# Patient Record
Sex: Female | Born: 1955 | Race: White | Hispanic: No | Marital: Married | Smoking: Current every day smoker
Health system: Southern US, Community
[De-identification: ages and names within clinical notes are randomized; demographics above are authoritative.]

## PROBLEM LIST (undated history)

## (undated) DIAGNOSIS — G43909 Migraine, unspecified, not intractable, without status migrainosus: Secondary | ICD-10-CM

## (undated) DIAGNOSIS — T7840XA Allergy, unspecified, initial encounter: Secondary | ICD-10-CM

## (undated) DIAGNOSIS — N390 Urinary tract infection, site not specified: Secondary | ICD-10-CM

## (undated) DIAGNOSIS — B019 Varicella without complication: Secondary | ICD-10-CM

## (undated) HISTORY — DX: Migraine, unspecified, not intractable, without status migrainosus: G43.909

## (undated) HISTORY — PX: BACK SURGERY: SHX140

## (undated) HISTORY — DX: Allergy, unspecified, initial encounter: T78.40XA

## (undated) HISTORY — PX: TONSILLECTOMY: SUR1361

## (undated) HISTORY — DX: Varicella without complication: B01.9

## (undated) HISTORY — PX: TUBAL LIGATION: SHX77

## (undated) HISTORY — DX: Urinary tract infection, site not specified: N39.0

---

## 2000-03-13 ENCOUNTER — Other Ambulatory Visit: Admission: RE | Admit: 2000-03-13 | Discharge: 2000-03-13 | Payer: Self-pay | Admitting: *Deleted

## 2000-03-13 ENCOUNTER — Encounter (INDEPENDENT_AMBULATORY_CARE_PROVIDER_SITE_OTHER): Payer: Self-pay

## 2000-04-24 ENCOUNTER — Other Ambulatory Visit: Admission: RE | Admit: 2000-04-24 | Discharge: 2000-04-24 | Payer: Self-pay | Admitting: *Deleted

## 2001-08-10 ENCOUNTER — Other Ambulatory Visit: Admission: RE | Admit: 2001-08-10 | Discharge: 2001-08-10 | Payer: Self-pay | Admitting: *Deleted

## 2008-02-22 ENCOUNTER — Ambulatory Visit (HOSPITAL_COMMUNITY): Admission: RE | Admit: 2008-02-22 | Discharge: 2008-02-23 | Payer: Self-pay | Admitting: Orthopaedic Surgery

## 2008-07-17 ENCOUNTER — Encounter: Admission: RE | Admit: 2008-07-17 | Discharge: 2008-07-17 | Payer: Self-pay | Admitting: Family Medicine

## 2009-07-20 ENCOUNTER — Encounter: Admission: RE | Admit: 2009-07-20 | Discharge: 2009-07-20 | Payer: Self-pay | Admitting: Family Medicine

## 2009-11-28 IMAGING — CR DG LUMBAR SPINE 2-3V
3 series · 3 of 3 positions shown · non-contrast
Comparison: MRI lumbar spine performed each hospital on 01/14/2008.

CLINICAL DATA: 51-year-old female with recurrent to right herniated
nucleus the pulses for microdiskectomy.

LUMBAR SPINE - 2-3 VIEW

[view not recorded (1 of 3)]
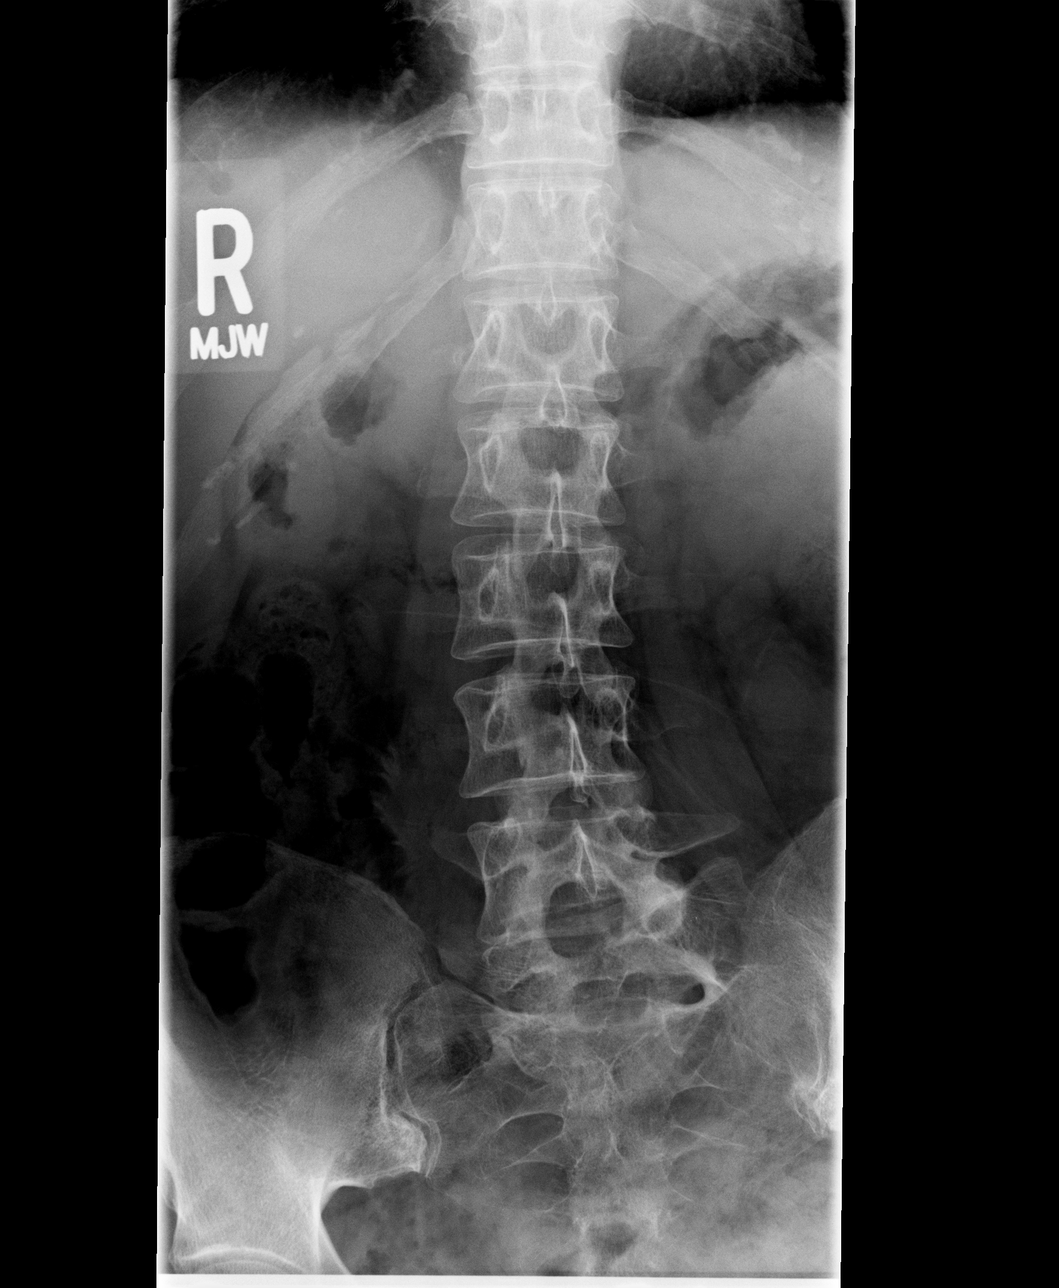

[view not recorded (2 of 3)]
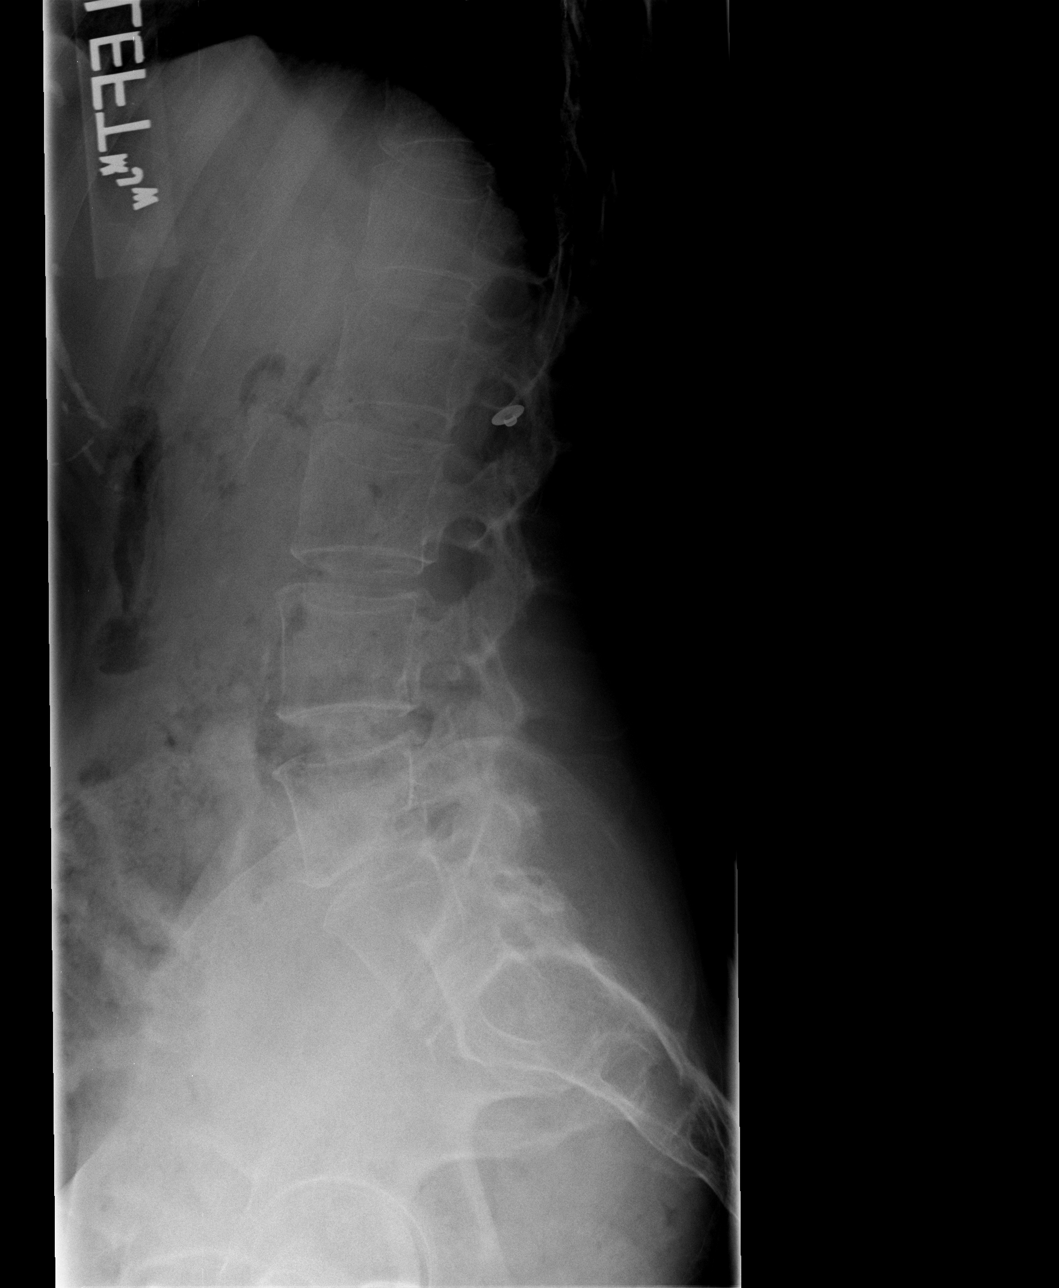

[view not recorded (3 of 3)]
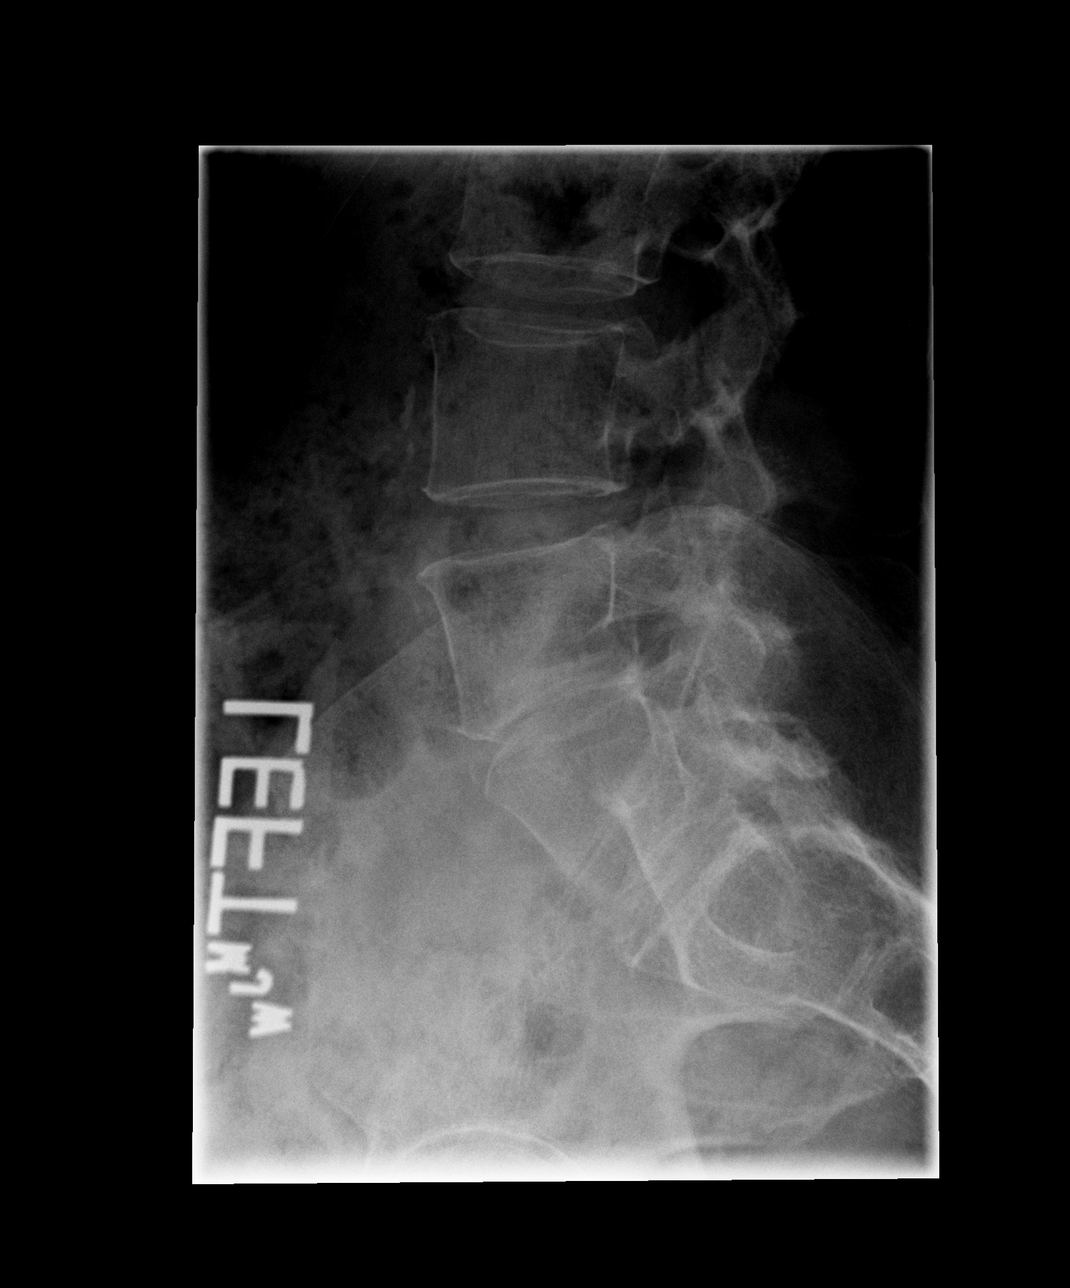

[3 of 3 positions shown; findings below may reference images not displayed]

FINDINGS: Transitional lumbosacral anatomy is identified with
partial lumbarization of S1.  The numbering scheme on this exam is
equivalent to that reported on the comparison MRI.

Rudimentary disc space at S1-S2 is noted.  Vertebral body height
and alignment appears stable without spondylolisthesis.  Visualized
sacrum and pelvic osseous structures appear intact.  Retained stool
the colon, nonobstructed bowel gas pattern.
IMPRESSION: 1.  Transitional anatomy with partial lumbarization of S1 as
reported on the comparison lumbar MRI.  The numbering scheme on
this exam is equivalent to that on the comparison.
2.  No acute osseous abnormality.

## 2010-07-21 ENCOUNTER — Encounter: Admission: RE | Admit: 2010-07-21 | Discharge: 2010-07-21 | Payer: Self-pay | Admitting: Family Medicine

## 2011-03-08 NOTE — Op Note (Signed)
[name], Theresa Collier NO.:  [id number]   MEDICAL RECORD NO.:  [id number]          PATIENT TYPE:   LOCATION:                                 FACILITY:   PHYSICIAN:  Mark C. Ophelia Charter, M.D.    DATE OF BIRTH:  08/06/1968   DATE OF PROCEDURE:  02/22/2008  DATE OF DISCHARGE:                               OPERATIVE REPORT   PREOPERATIVE DIAGNOSIS:  Recurrent L5-S1 herniated nucleus pulposus,  right, with radiculopathy.   POSTOPERATIVE DIAGNOSIS:  Recurrent L5-S1 herniated nucleus pulposus,  right, with radiculopathy.   PROCEDURES:  1. Right L5-S1 microdiskectomy.  2. Removal of free fragment for recurrent herniated nucleus pulposus.   SURGEON:  Mark C. Ophelia Charter, M.D.   ASSISTANT:  Wende Neighbors, P.A.   ANESTHESIA:  GET plus 10 mL Marcaine local.   ESTIMATED BLOOD LOSS:  Minimal.   DRAINS:  None.   DESCRIPTION OF PROCEDURE:  After induction of general anesthesia  orotracheal intubation, the patient was placed in [place] frame with  careful padding and positioning.  Time-out check list was performed.  Preoperative antibiotics were given.  The area was prepped with DuraPrep  and scrubbed with towels.  Sterile skin marker was used on the old  incision and Biodrape was applied.  Needle localization with the needle  showed that the needle was at L4-L5, one level above.  Incision started  just below this exposing L5-S1 space and the previous laminotomy  corresponding with the MRI scan was noted.  There was blood noted on the  dorsum at the mid inferior aspect of laminectomy which was in line with  the top portion of the disk.  Starting proximally, the dura was exposed  proximally.  Gutter was followed laterally along the right side removing  chunks of ligament and some overlying spurs.  Disk space was identified,  and immediately fragment was noted.  It was teased with ball-tipped  nerve hook, grasped, and removed in pieces.  It was adherent to the  anterior aspect  of the nerve.  Nerve root was carefully freed up.  A  foraminotomy was performed,  Nerve root was free on all sides.  Passes  were made to the disk space with straight and up micropituitary, down  micropituitary; 180-degree sweep anterior to the dura was made with the  dural separator with no areas of compression, and nerve root was  completely decompressed.  After irrigation with saline solution, one  final look using microscope again showed no remaining fragments.  Wound  was irrigated and closed in layers, 0 Vicryl in deep fascia, 2-0 in  subcutaneous tissue, subcuticular 4-0 Vicryl closure.  Benzoin, Steri-  Strips,  Marcaine infiltration, postoperative dressing, and transferred to  recovery room.  The patient had normal peroneal and gastroc-soleus  strength with improvement of her preoperative weakness and relief of  preoperative leg pain on exam in PACU.      Mark C. Ophelia Charter, M.D.  Electronically Signed     MCY/MEDQ  D:  02/22/2008  T:  02/23/2008  Job:  [id number]

## 2011-05-09 ENCOUNTER — Other Ambulatory Visit: Payer: Self-pay | Admitting: Legal Medicine

## 2011-05-09 ENCOUNTER — Other Ambulatory Visit: Payer: Self-pay | Admitting: *Deleted

## 2011-05-09 DIAGNOSIS — Z780 Asymptomatic menopausal state: Secondary | ICD-10-CM

## 2011-05-13 ENCOUNTER — Other Ambulatory Visit: Payer: Self-pay

## 2011-07-19 LAB — URINALYSIS, ROUTINE W REFLEX MICROSCOPIC
Bilirubin Urine: NEGATIVE
Hgb urine dipstick: NEGATIVE
Ketones, ur: NEGATIVE
Nitrite: NEGATIVE
Protein, ur: NEGATIVE
Urobilinogen, UA: 0.2

## 2011-07-19 LAB — CBC
HCT: 43.7
Hemoglobin: 14.8
MCHC: 33.8
RBC: 4.84
RDW: 14.2

## 2011-07-19 LAB — COMPREHENSIVE METABOLIC PANEL
Alkaline Phosphatase: 84
BUN: 3 — ABNORMAL LOW
CO2: 30
GFR calc non Af Amer: >60
Glucose, Bld: 93
Potassium: 4.2
Total Bilirubin: 0.7
Total Protein: 6.2

## 2011-07-19 LAB — PROTIME-INR
INR: 0.9
Prothrombin Time: 11.8

## 2011-07-19 LAB — APTT: aPTT: 27

## 2011-07-19 LAB — DIFFERENTIAL
Basophils Absolute: 0.1
Basophils Relative: 1
Eosinophils Absolute: 0.1
Monocytes Relative: 4
Neutro Abs: 6.5
Neutrophils Relative %: 68

## 2016-06-30 DIAGNOSIS — Z0000 Encounter for general adult medical examination without abnormal findings: Secondary | ICD-10-CM | POA: Diagnosis not present

## 2016-06-30 DIAGNOSIS — F17200 Nicotine dependence, unspecified, uncomplicated: Secondary | ICD-10-CM | POA: Diagnosis not present

## 2016-07-19 DIAGNOSIS — Z23 Encounter for immunization: Secondary | ICD-10-CM | POA: Diagnosis not present

## 2016-09-28 DIAGNOSIS — Z1231 Encounter for screening mammogram for malignant neoplasm of breast: Secondary | ICD-10-CM | POA: Diagnosis not present

## 2017-07-26 DIAGNOSIS — E7800 Pure hypercholesterolemia, unspecified: Secondary | ICD-10-CM | POA: Diagnosis not present

## 2017-07-26 DIAGNOSIS — E559 Vitamin D deficiency, unspecified: Secondary | ICD-10-CM | POA: Diagnosis not present

## 2017-07-26 DIAGNOSIS — Z1151 Encounter for screening for human papillomavirus (HPV): Secondary | ICD-10-CM | POA: Diagnosis not present

## 2017-07-26 DIAGNOSIS — Z01419 Encounter for gynecological examination (general) (routine) without abnormal findings: Secondary | ICD-10-CM | POA: Diagnosis not present

## 2017-07-26 DIAGNOSIS — Z1389 Encounter for screening for other disorder: Secondary | ICD-10-CM | POA: Diagnosis not present

## 2017-07-26 DIAGNOSIS — Z23 Encounter for immunization: Secondary | ICD-10-CM | POA: Diagnosis not present

## 2017-08-01 DIAGNOSIS — Z23 Encounter for immunization: Secondary | ICD-10-CM | POA: Diagnosis not present

## 2017-09-28 DIAGNOSIS — Z1231 Encounter for screening mammogram for malignant neoplasm of breast: Secondary | ICD-10-CM | POA: Diagnosis not present

## 2017-09-29 DIAGNOSIS — F411 Generalized anxiety disorder: Secondary | ICD-10-CM | POA: Diagnosis not present

## 2017-10-13 DIAGNOSIS — F411 Generalized anxiety disorder: Secondary | ICD-10-CM | POA: Diagnosis not present

## 2017-10-24 DIAGNOSIS — R55 Syncope and collapse: Secondary | ICD-10-CM | POA: Diagnosis not present

## 2017-10-24 DIAGNOSIS — I10 Essential (primary) hypertension: Secondary | ICD-10-CM | POA: Diagnosis not present

## 2017-10-24 DIAGNOSIS — D869 Sarcoidosis, unspecified: Secondary | ICD-10-CM | POA: Diagnosis not present

## 2017-10-24 DIAGNOSIS — R079 Chest pain, unspecified: Secondary | ICD-10-CM | POA: Diagnosis not present

## 2017-11-13 DIAGNOSIS — F411 Generalized anxiety disorder: Secondary | ICD-10-CM | POA: Diagnosis not present

## 2017-12-14 DIAGNOSIS — F411 Generalized anxiety disorder: Secondary | ICD-10-CM | POA: Diagnosis not present

## 2018-07-06 DIAGNOSIS — F411 Generalized anxiety disorder: Secondary | ICD-10-CM | POA: Diagnosis not present

## 2018-07-06 DIAGNOSIS — Z0000 Encounter for general adult medical examination without abnormal findings: Secondary | ICD-10-CM | POA: Diagnosis not present

## 2018-07-06 DIAGNOSIS — F17200 Nicotine dependence, unspecified, uncomplicated: Secondary | ICD-10-CM | POA: Diagnosis not present

## 2018-07-06 DIAGNOSIS — Z1159 Encounter for screening for other viral diseases: Secondary | ICD-10-CM | POA: Diagnosis not present

## 2018-08-08 DIAGNOSIS — Z23 Encounter for immunization: Secondary | ICD-10-CM | POA: Diagnosis not present

## 2018-10-03 DIAGNOSIS — Z1231 Encounter for screening mammogram for malignant neoplasm of breast: Secondary | ICD-10-CM | POA: Diagnosis not present

## 2018-12-06 DIAGNOSIS — J019 Acute sinusitis, unspecified: Secondary | ICD-10-CM | POA: Diagnosis not present

## 2018-12-06 DIAGNOSIS — Z6821 Body mass index (BMI) 21.0-21.9, adult: Secondary | ICD-10-CM | POA: Diagnosis not present

## 2019-11-21 ENCOUNTER — Ambulatory Visit: Payer: Self-pay | Attending: Internal Medicine

## 2019-11-21 DIAGNOSIS — Z20822 Contact with and (suspected) exposure to covid-19: Secondary | ICD-10-CM

## 2019-11-21 DIAGNOSIS — U071 COVID-19: Secondary | ICD-10-CM | POA: Insufficient documentation

## 2019-11-22 ENCOUNTER — Ambulatory Visit: Payer: Self-pay

## 2019-11-22 LAB — NOVEL CORONAVIRUS, NAA: SARS-CoV-2, NAA: DETECTED — AB

## 2019-11-22 NOTE — Telephone Encounter (Addendum)
Provided covid test results to Patient.  Reviewed isolation stopping protocol.  Voiced understanding.  Patient has no Sx.  Provided care Advice

## 2020-10-02 ENCOUNTER — Other Ambulatory Visit: Payer: Self-pay

## 2020-10-02 ENCOUNTER — Ambulatory Visit
Admission: EM | Admit: 2020-10-02 | Discharge: 2020-10-02 | Disposition: A | Discharge status: Home / Self Care | Payer: Self-pay | Attending: Family Medicine | Admitting: Family Medicine

## 2020-10-02 DIAGNOSIS — R300 Dysuria: Secondary | ICD-10-CM | POA: Insufficient documentation

## 2020-10-02 LAB — POCT URINALYSIS DIP (MANUAL ENTRY)
Bilirubin, UA: NEGATIVE
Glucose, UA: NEGATIVE mg/dL
Ketones, POC UA: NEGATIVE mg/dL
Nitrite, UA: NEGATIVE
Protein Ur, POC: NEGATIVE mg/dL
Spec Grav, UA: 1.01 (ref 1.010–1.025)
Urobilinogen, UA: 0.2 E.U./dL
pH, UA: 6.5 (ref 5.0–8.0)

## 2020-10-02 MED ORDER — CEPHALEXIN 500 MG PO CAPS: 500.0000 mg | ORAL_CAPSULE | Freq: Two times a day (BID) | ORAL | 0 refills | Status: AC

## 2020-10-02 NOTE — ED Triage Notes (Signed)
Pt reports having urinary frequency, burning with urination x3 days. sts she have been taking AZO since Wednesday without relief.

## 2020-10-02 NOTE — Discharge Instructions (Addendum)
Treating you for a urinary tract infection Take the antibiotic as prescribed.  Drink plenty of water to stay hydrated. AZO as needed

## 2020-10-02 NOTE — ED Provider Notes (Signed)
Theresa Collier    CSN: [id number] Arrival date & time: 10/02/20  1013      History   Chief Complaint Chief Complaint  Patient presents with  . Dysuria    HPI Theresa Collier is a 64 y.o. female.   Patient is a 64 year old female presents today with urinary frequency, dysuria for 3 days.  Has been taking AZO with some relief.  No fevers, chills, nausea, flank pain.     History reviewed. No pertinent past medical history.  There are no problems to display for this patient.   Past Surgical History:  Procedure Laterality Date  . BACK SURGERY    . TONSILLECTOMY    . TUBAL LIGATION      OB History   No obstetric history on file.      Home Medications    Prior to Admission medications   Medication Sig Start Date End Date Taking? Authorizing Provider  cephALEXin (KEFLEX) 500 MG capsule Take 1 capsule (500 mg total) by mouth 2 (two) times daily for 5 days. 10/02/20 10/07/20  Janace Aris, NP    Family History History reviewed. No pertinent family history.  Social History Social History   Tobacco Use  . Smoking status: Current Every Day Smoker    Types: Cigarettes  . Smokeless tobacco: Never Used  Substance Use Topics  . Alcohol use: Never  . Drug use: Never     Allergies   Promethazine hcl   Review of Systems Review of Systems   Physical Exam Triage Vital Signs ED Triage Vitals  Enc Vitals Group     BP 10/02/20 1018 126/81     Pulse Rate 10/02/20 1018 87     Resp 10/02/20 1018 16     Temp 10/02/20 1018 98.2 F (36.8 C)     Temp Source 10/02/20 1018 Oral     SpO2 10/02/20 1018 97 %     Weight 10/02/20 1019 135 lb (61.2 kg)     Height 10/02/20 1019 5\' 9"  (1.753 m)     Head Circumference --      Peak Flow --      Pain Score 10/02/20 1019 6     Pain Loc --      Pain Edu? --      Excl. in [place]? --    No data found.  Updated Vital Signs BP 126/81   Pulse 87   Temp 98.2 F (36.8 C) (Oral)   Resp 16   Ht 5\' 9"  (1.753 m)   Wt  135 lb (61.2 kg)   SpO2 97%   BMI 19.94 kg/m   Visual Acuity Right Eye Distance:   Left Eye Distance:   Bilateral Distance:    Right Eye Near:   Left Eye Near:    Bilateral Near:     Physical Exam Vitals and nursing note reviewed.  Constitutional:      General: She is not in acute distress.    Appearance: Normal appearance. She is not ill-appearing, toxic-appearing or diaphoretic.  HENT:     Head: Normocephalic.     Nose: Nose normal.  Eyes:     Conjunctiva/sclera: Conjunctivae normal.  Pulmonary:     Effort: Pulmonary effort is normal.  Musculoskeletal:        General: Normal range of motion.     Cervical back: Normal range of motion.  Skin:    General: Skin is warm and dry.     Findings: No rash.  Neurological:  Mental Status: She is alert.  Psychiatric:        Mood and Affect: Mood normal.      UC Treatments / Results  Labs (all labs ordered are listed, but only abnormal results are displayed) Labs Reviewed  POCT URINALYSIS DIP (MANUAL ENTRY) - Abnormal; Notable for the following components:      Result Value   Blood, UA moderate (*)    Leukocytes, UA Small (1+) (*)    All other components within normal limits  URINE CULTURE    EKG   Radiology No results found.  Procedures Procedures (including critical care time)  Medications Ordered in UC Medications - No data to display  Initial Impression / Assessment and Plan / UC Course  I have reviewed the triage vital signs and the nursing notes.  Pertinent labs & imaging results that were available during my care of the patient were reviewed by me and considered in my medical decision making (see chart for details).     Dysuria Urine with small leuks and moderate blood.  Sending for culture.  We'll go ahead and cover with antibiotics at this time based on symptoms and urinalysis.   Push fluids. Follow up as needed for continued or worsening symptoms   Final Clinical Impressions(s) / UC  Diagnoses   Final diagnoses:  Dysuria     Discharge Instructions     Treating you for a urinary tract infection Take the antibiotic as prescribed.  Drink plenty of water to stay hydrated. AZO as needed    ED Prescriptions    Medication Sig Dispense Auth. Provider   cephALEXin (KEFLEX) 500 MG capsule Take 1 capsule (500 mg total) by mouth 2 (two) times daily for 5 days. 10 capsule Dahlia Byes A, NP     PDMP not reviewed this encounter.   Janace Aris, NP 10/02/20 1105

## 2020-10-04 LAB — URINE CULTURE: Culture: 50000 — AB

## 2022-09-08 ENCOUNTER — Ambulatory Visit (LOCAL_COMMUNITY_HEALTH_CENTER): Payer: Self-pay

## 2022-09-08 DIAGNOSIS — Z23 Encounter for immunization: Secondary | ICD-10-CM

## 2022-09-08 DIAGNOSIS — Z719 Counseling, unspecified: Secondary | ICD-10-CM

## 2022-09-08 NOTE — Progress Notes (Signed)
  Are you feeling sick today? No   Have you ever received a dose of COVID-19 Vaccine? [employer], [place], [place], [place], Other) Yes  If yes, which vaccine and how many doses?    Pfizer 5 doses  Did you bring the vaccination record card or other documentation?  Yes   Do you have a health condition or are undergoing treatment that makes you moderately or severely immunocompromised? This would include, but not be limited to: cancer, HIV, organ transplant, immunosuppressive therapy/high-dose corticosteroids, or moderate/severe primary immunodeficiency.  No  Have you received COVID-19 vaccine before or during hematopoietic cell transplant (HCT) or CAR-T-cell therapies? No  Have you ever had an allergic reaction to: (This would include a severe allergic reaction or a reaction that caused hives, swelling, or respiratory distress, including wheezing.) A component of a COVID-19 vaccine or a previous dose of COVID-19 vaccine? No   Have you ever had an allergic reaction to another vaccine (other thanCOVID-19 vaccine) or an injectable medication? (This would include a severe allergic reaction or a reaction that caused hives, swelling, or respiratory distress, including wheezing.)   No    Do you have a history of any of the following:  Myocarditis or Pericarditis No  Dermal fillers:  No  Multisystem Inflammatory Syndrome (MIS-C or MIS-A)? No  COVID-19 disease within the past 3 months? No  Vaccinated with monkeypox vaccine in the last 4 weeks? No  Administered Pfizer Comirnaty 12+ 2023-24 formula IM right deltoid; tolerated well.  VIS given.  Updated copy of [facility] given.   Henriette Combs RN

## 2022-12-23 ENCOUNTER — Ambulatory Visit
Admission: EM | Admit: 2022-12-23 | Discharge: 2022-12-23 | Disposition: A | Discharge status: Home / Self Care | Payer: Self-pay | Attending: Urgent Care | Admitting: Urgent Care

## 2022-12-23 DIAGNOSIS — J0100 Acute maxillary sinusitis, unspecified: Secondary | ICD-10-CM

## 2022-12-23 DIAGNOSIS — R6889 Other general symptoms and signs: Secondary | ICD-10-CM

## 2022-12-23 MED ORDER — PREDNISONE 20 MG PO TABS: ORAL_TABLET | ORAL | 0 refills | Status: DC

## 2022-12-23 NOTE — Discharge Instructions (Signed)
Follow up here or with your primary care provider if your symptoms are worsening or not improving.    

## 2022-12-23 NOTE — ED Triage Notes (Signed)
Pt states son had flu B last week. Now having cough, with tightness in chest, SOB, chills runny nose, then congestion, that started Sunday. Taking delsym, mucinex, Advil.

## 2022-12-23 NOTE — ED Provider Notes (Signed)
Roderic Palau    CSN: [id number] Arrival date & time: 12/23/22  1031      History   Chief Complaint Chief Complaint  Patient presents with   Cough    HPI Theresa Collier is a 67 y.o. female.    Cough   Presents to urgent care with symptoms of cough, chest tightness, shortness of breath, chills, runny nose, nasal/sinus congestion starting 5 days ago.  She states her son was diagnosed with flu B last week.  She is taking Delsym, Mucinex, Advil.   History reviewed. No pertinent past medical history.  There are no problems to display for this patient.   Past Surgical History:  Procedure Laterality Date   BACK SURGERY     TONSILLECTOMY     TUBAL LIGATION      OB History   No obstetric history on file.      Home Medications    Prior to Admission medications   Not on File    Family History History reviewed. No pertinent family history.  Social History Social History   Tobacco Use   Smoking status: Every Day    Types: Cigarettes   Smokeless tobacco: Never  Substance Use Topics   Alcohol use: Never   Drug use: Never     Allergies   Promethazine hcl   Review of Systems Review of Systems  Respiratory:  Positive for cough.      Physical Exam Triage Vital Signs ED Triage Vitals  Enc Vitals Group     BP 12/23/22 1153 102/63     Pulse Rate 12/23/22 1153 84     Resp 12/23/22 1153 20     Temp 12/23/22 1153 98.9 F (37.2 C)     Temp Source 12/23/22 1153 Oral     SpO2 12/23/22 1153 98 %     Weight --      Height --      Head Circumference --      Peak Flow --      Pain Score 12/23/22 1154 0     Pain Loc --      Pain Edu? --      Excl. in [place]? --    No data found.  Updated Vital Signs BP 102/63 (BP Location: Right Arm)   Pulse 84   Temp 98.9 F (37.2 C) (Oral)   Resp 20   SpO2 98%   Visual Acuity Right Eye Distance:   Left Eye Distance:   Bilateral Distance:    Right Eye Near:   Left Eye Near:    Bilateral Near:      Physical Exam Vitals reviewed.  Constitutional:      Appearance: Normal appearance. She is ill-appearing.  Cardiovascular:     Rate and Rhythm: Normal rate and regular rhythm.     Pulses: Normal pulses.     Heart sounds: Normal heart sounds.  Pulmonary:     Effort: Pulmonary effort is normal.     Breath sounds: Normal breath sounds.  Skin:    General: Skin is warm and dry.  Neurological:     General: No focal deficit present.     Mental Status: She is alert and oriented to person, place, and time.  Psychiatric:        Mood and Affect: Mood normal.        Behavior: Behavior normal.      UC Treatments / Results  Labs (all labs ordered are listed, but only abnormal results are displayed)  Labs Reviewed - No data to display  EKG   Radiology No results found.  Procedures Procedures (including critical care time)  Medications Ordered in UC Medications - No data to display  Initial Impression / Assessment and Plan / UC Course  I have reviewed the triage vital signs and the nursing notes.  Pertinent labs & imaging results that were available during my care of the patient were reviewed by me and considered in my medical decision making (see chart for details).   Patient is afebrile here without recent antipyretics. Satting well on room air. Overall is ill appearing, well hydrated, without respiratory distress. Pulmonary exam is unremarkable.  Lungs CTAB without wheezing, rhonchi, rales.  Patient's symptoms are consistent with an acute viral process, perhaps flu B which her son was diagnosed with.  Antiviral therapy will not be recommended given the duration of symptoms as well as possible flu B.  Recommending treatment of symptoms with continued use of OTC medications as well as prescription medications where needed.  Given her self description of severe sinus symptoms, offered anti-inflammatory steroid which she states she has taken in the past for sinusitis and tolerated.   Discussed prescribing cough suppressant which she declined.  Final Clinical Impressions(s) / UC Diagnoses   Final diagnoses:  None   Discharge Instructions   None    ED Prescriptions   None    PDMP not reviewed this encounter.   Rose Phi, [place] 12/23/22 1219

## 2022-12-28 ENCOUNTER — Ambulatory Visit
Admission: EM | Admit: 2022-12-28 | Discharge: 2022-12-28 | Disposition: A | Discharge status: Home / Self Care | Payer: Self-pay | Attending: Urgent Care | Admitting: Urgent Care

## 2022-12-28 DIAGNOSIS — J019 Acute sinusitis, unspecified: Secondary | ICD-10-CM

## 2022-12-28 DIAGNOSIS — B9689 Other specified bacterial agents as the cause of diseases classified elsewhere: Secondary | ICD-10-CM

## 2022-12-28 MED ORDER — AMOXICILLIN-POT CLAVULANATE 875-125 MG PO TABS: 1.0000 | ORAL_TABLET | Freq: Two times a day (BID) | ORAL | 0 refills | Status: DC

## 2022-12-28 MED ORDER — PREDNISONE 20 MG PO TABS: ORAL_TABLET | ORAL | 0 refills | Status: AC

## 2022-12-28 NOTE — Discharge Instructions (Signed)
Follow up here or with your primary care provider if your symptoms are worsening or not improving with treatment.          

## 2022-12-28 NOTE — ED Provider Notes (Addendum)
Roderic Palau    CSN: [id number] Arrival date & time: 12/28/22  [id number]      History   Chief Complaint Chief Complaint  Patient presents with   Nasal Congestion    HPI KEIERRA DEHAY is a 67 y.o. female.   HPI  Presents to urgent care with recurrent complaint of cough, shortness of breath, sinus pressure, congestion.  Patient states she was seen last week and prescribed steroids.  States no improvement in her symptoms.  Her chart indicates that she was seen 5 days ago for flulike symptoms starting 5 days previously.  She reported contact with influenza B.  No antiviral treatment was available at visit.  She reports facial pain/pressure above and below her eyes bilaterally with discomfort in her ears.  History reviewed. No pertinent past medical history.  There are no problems to display for this patient.   Past Surgical History:  Procedure Laterality Date   BACK SURGERY     TONSILLECTOMY     TUBAL LIGATION      OB History   No obstetric history on file.      Home Medications    Prior to Admission medications   Medication Sig Start Date End Date Taking? Authorizing Provider  predniSONE (DELTASONE) 20 MG tablet Take 3 tablets (60 mg total) by mouth daily with breakfast for 2 days, THEN 2 tablets (40 mg total) daily with breakfast for 2 days, THEN 1 tablet (20 mg total) daily with breakfast for 2 days. 12/23/22 12/28/22 Yes Ashantae Pangallo, Annie Main, FNP    Family History History reviewed. No pertinent family history.  Social History Social History   Tobacco Use   Smoking status: Every Day    Types: Cigarettes   Smokeless tobacco: Never  Substance Use Topics   Alcohol use: Never   Drug use: Never     Allergies   Promethazine hcl   Review of Systems Review of Systems   Physical Exam Triage Vital Signs ED Triage Vitals  Enc Vitals Group     BP 12/28/22 0915 114/64     Pulse Rate 12/28/22 0915 63     Resp 12/28/22 0915 18     Temp 12/28/22  0913 98 F (36.7 C)     Temp Source 12/28/22 0913 Oral     SpO2 12/28/22 0915 98 %     Weight --      Height --      Head Circumference --      Peak Flow --      Pain Score 12/28/22 0915 8     Pain Loc --      Pain Edu? --      Excl. in [place]? --    No data found.  Updated Vital Signs BP 114/64 (BP Location: Right Arm)   Pulse 63   Temp 98 F (36.7 C) (Oral)   Resp 18   SpO2 98%   Visual Acuity Right Eye Distance:   Left Eye Distance:   Bilateral Distance:    Right Eye Near:   Left Eye Near:    Bilateral Near:     Physical Exam Vitals reviewed.  Constitutional:      Appearance: Normal appearance. She is ill-appearing.  HENT:     Nose:     Right Sinus: Maxillary sinus tenderness present.     Left Sinus: Maxillary sinus tenderness present.  Cardiovascular:     Rate and Rhythm: Normal rate and regular rhythm.     Pulses:  Normal pulses.     Heart sounds: Normal heart sounds.  Pulmonary:     Effort: Pulmonary effort is normal.     Breath sounds: Normal breath sounds.  Skin:    General: Skin is warm and dry.  Neurological:     General: No focal deficit present.     Mental Status: She is alert and oriented to person, place, and time.  Psychiatric:        Mood and Affect: Mood normal.        Behavior: Behavior normal.      UC Treatments / Results  Labs (all labs ordered are listed, but only abnormal results are displayed) Labs Reviewed - No data to display  EKG   Radiology No results found.  Procedures Procedures (including critical care time)  Medications Ordered in UC Medications - No data to display  Initial Impression / Assessment and Plan / UC Course  I have reviewed the triage vital signs and the nursing notes.  Pertinent labs & imaging results that were available during my care of the patient were reviewed by me and considered in my medical decision making (see chart for details).   Patient is afebrile here without recent antipyretics.  Satting well on room air. Overall is ill appearing, well hydrated, without respiratory distress. Pulmonary exam is unremarkable.  Lungs CTAB without wheezing, rhonchi, rales. Maxillary sinus tenderness bilaterally with palpation.  Presumed acute bacterial sinusitis secondary to past viral URI.  Will treat with Augmentin x 7 days.  Additionally will give another course of prednisone.  Final Clinical Impressions(s) / UC Diagnoses   Final diagnoses:  None   Discharge Instructions   None    ED Prescriptions   None    PDMP not reviewed this encounter.   Rose Phi, FNP 12/28/22 0932    Rose Phi, [place] 12/28/22 [phone]

## 2022-12-28 NOTE — ED Triage Notes (Signed)
Pt came here last week with cough, SOB, sinus pressure, congestion and was prescribed steroids and states she is not feeling any better.

## 2023-03-30 ENCOUNTER — Encounter: Payer: Self-pay | Admitting: Nurse Practitioner

## 2023-03-30 ENCOUNTER — Ambulatory Visit (INDEPENDENT_AMBULATORY_CARE_PROVIDER_SITE_OTHER): Payer: Self-pay | Admitting: Nurse Practitioner

## 2023-03-30 VITALS — BP 116/64 | HR 63 | Temp 98.8°F | Ht 68.0 in | Wt 125.0 lb

## 2023-03-30 DIAGNOSIS — S29011A Strain of muscle and tendon of front wall of thorax, initial encounter: Secondary | ICD-10-CM

## 2023-03-30 NOTE — Progress Notes (Signed)
Bethanie Dicker, NP-C Phone: [phone]  Theresa Collier is a 67 y.o. female who presents today to establish care.   Patient reports a fall that occurred 3 weeks ago where she hit her left side. She fell into a cabinet in her closet and hit her left elbow and ribs. She continues to have pain on her left ribcage and chest. The pain is intermittent, it has been gradually improving over the last 3 weeks. She is unable to sleep on that side due to the pain. Nothing makes the pain better, she has tried Tylenol, Ibuprofen, ice and heat without relief. She denies bruising in the area. She denies shortness of breath. She is not limited in her activities. She does not currently have insurance, she is getting Medicare in September. She has no significant past medical history. She is not on any medications. She does not feel that she needs a x-ray.    Active Ambulatory Problems    Diagnosis Date Noted   Chest wall muscle strain 04/12/2023   Resolved Ambulatory Problems    Diagnosis Date Noted   No Resolved Ambulatory Problems   Past Medical History:  Diagnosis Date   Allergy    Chicken pox    Migraines    UTI (urinary tract infection)     History reviewed. No pertinent family history.  Social History   Socioeconomic History   Marital status: Married    Spouse name: Not on file   Number of children: Not on file   Years of education: Not on file   Highest education level: Not on file  Occupational History   Not on file  Tobacco Use   Smoking status: Every Day    Types: Cigarettes   Smokeless tobacco: Never  Substance and Sexual Activity   Alcohol use: Never   Drug use: Never   Sexual activity: Not Currently  Other Topics Concern   Not on file  Social History Narrative   Not on file   Social Determinants of Health   Financial Resource Strain: Not on file  Food Insecurity: Not on file  Transportation Needs: Not on file  Physical Activity: Not on file  Stress: Not on file   Social Connections: Not on file  Intimate Partner Violence: Not on file    ROS  General:  Negative for unexplained weight loss, fever Skin: Negative for new or changing mole, sore that won't heal HEENT: Negative for trouble hearing, trouble seeing, ringing in ears, mouth sores, hoarseness, change in voice, dysphagia. CV:  Negative for chest pain, dyspnea, edema, palpitations Resp: Negative for cough, dyspnea, hemoptysis GI: Negative for nausea, vomiting, diarrhea, constipation, abdominal pain, melena, hematochezia. GU: Negative for dysuria, incontinence, urinary hesitance, hematuria, vaginal or penile discharge, polyuria, sexual difficulty, lumps in testicle or breasts MSK: Negative for muscle cramps or joint swelling Neuro: Negative for headaches, weakness, numbness, dizziness, passing out/fainting Psych: Negative for depression, anxiety, memory problems  Objective  Physical Exam Vitals:   03/30/23 1004  BP: 116/64  Pulse: 63  Temp: 98.8 F (37.1 C)  SpO2: 99%    BP Readings from Last 3 Encounters:  03/30/23 116/64  12/28/22 114/64  12/23/22 102/63   Wt Readings from Last 3 Encounters:  03/30/23 125 lb (56.7 kg)  10/02/20 135 lb (61.2 kg)    Physical Exam Exam conducted with a chaperone present Donavan Foil, CMA).  Constitutional:      General: She is not in acute distress.    Appearance: Normal appearance.  HENT:  Head: Normocephalic.  Cardiovascular:     Rate and Rhythm: Normal rate and regular rhythm.     Heart sounds: Normal heart sounds.  Pulmonary:     Effort: Pulmonary effort is normal.     Breath sounds: Normal breath sounds.  Chest:     Chest wall: Tenderness (left axilla- 5th and 6th ribs) present. No deformity or swelling.  Breasts:    Right: Normal.     Left: Normal. No tenderness.  Skin:    General: Skin is warm and dry.     Findings: No bruising.  Neurological:     General: No focal deficit present.     Mental Status: She is  alert.  Psychiatric:        Mood and Affect: Mood normal.        Behavior: Behavior normal.    Assessment/Plan:   Muscle strain of chest wall, initial encounter Assessment & Plan: Likely musculoskeletal in nature, consistent with muscle strain from fall. She politely declined chest x-ray today. No bruising noted on exam. Lungs clear with good air movement. Mild tenderness on palpation of left axilla and ribs. Her pain has been gradually improving. Advised to continue alternating ice and heat on the painful area. She can continue Tylenol and Ibuprofen for pain as needed. Return precautions given to patient, advised to seek care if she develops shortness of breath, worsening pain or difficulty breathing.     Return in about 4 months (around 07/30/2023) for Welcome to Medicare Visit, sooner PRN.   Bethanie Dicker, NP-C [facility] [employer]

## 2023-04-12 ENCOUNTER — Encounter: Payer: Self-pay | Admitting: Nurse Practitioner

## 2023-04-12 DIAGNOSIS — S29011A Strain of muscle and tendon of front wall of thorax, initial encounter: Secondary | ICD-10-CM | POA: Insufficient documentation

## 2023-04-12 NOTE — Assessment & Plan Note (Addendum)
Likely musculoskeletal in nature, consistent with muscle strain from fall. She politely declined chest x-ray today. No bruising noted on exam. Lungs clear with good air movement. Mild tenderness on palpation of left axilla and ribs. Her pain has been gradually improving. Advised to continue alternating ice and heat on the painful area. She can continue Tylenol and Ibuprofen for pain as needed. Return precautions given to patient, advised to seek care if she develops shortness of breath, worsening pain or difficulty breathing.

## 2023-05-04 ENCOUNTER — Ambulatory Visit: Payer: Self-pay | Admitting: Family Medicine

## 2023-05-12 ENCOUNTER — Telehealth: Payer: Self-pay | Admitting: Nurse Practitioner

## 2023-05-12 NOTE — Telephone Encounter (Signed)
Letter mailed informing patient they are due for colon cancer screening.

## 2023-07-13 ENCOUNTER — Other Ambulatory Visit: Payer: Self-pay

## 2023-07-13 ENCOUNTER — Telehealth: Payer: Self-pay

## 2023-07-13 DIAGNOSIS — Z1231 Encounter for screening mammogram for malignant neoplasm of breast: Secondary | ICD-10-CM

## 2023-07-13 NOTE — Telephone Encounter (Signed)
Detailed VM left ifnorming pt of overdue mammogram and that she can give [facility] a call at [phone] to get scheduled   Address: [address], [place], [place] [address]

## 2023-07-28 ENCOUNTER — Other Ambulatory Visit: Payer: Self-pay | Admitting: Nurse Practitioner

## 2023-07-28 DIAGNOSIS — Z1212 Encounter for screening for malignant neoplasm of rectum: Secondary | ICD-10-CM

## 2023-07-28 DIAGNOSIS — Z1211 Encounter for screening for malignant neoplasm of colon: Secondary | ICD-10-CM

## 2023-08-01 ENCOUNTER — Encounter: Payer: Self-pay | Admitting: Nurse Practitioner

## 2024-09-02 DIAGNOSIS — H524 Presbyopia: Secondary | ICD-10-CM | POA: Diagnosis not present
